# Patient Record
Sex: Male | Born: 1960 | Race: White | Hispanic: No | Marital: Single
Health system: Southern US, Community
[De-identification: ages and names within clinical notes are randomized; demographics above are authoritative.]

---

## 1998-08-31 ENCOUNTER — Inpatient Hospital Stay (HOSPITAL_COMMUNITY): Admission: EM | Admit: 1998-08-31 | Discharge: 1998-09-12 | Payer: Self-pay | Admitting: Emergency Medicine

## 1999-07-30 ENCOUNTER — Encounter: Payer: Self-pay | Admitting: Internal Medicine

## 1999-07-30 ENCOUNTER — Inpatient Hospital Stay (HOSPITAL_COMMUNITY): Admission: EM | Admit: 1999-07-30 | Discharge: 1999-08-07 | Payer: Self-pay | Admitting: *Deleted

## 1999-11-22 ENCOUNTER — Ambulatory Visit (HOSPITAL_COMMUNITY): Admission: RE | Admit: 1999-11-22 | Discharge: 1999-11-22 | Payer: Self-pay | Admitting: Geriatric Medicine

## 1999-12-26 ENCOUNTER — Encounter
Admission: RE | Admit: 1999-12-26 | Discharge: 2000-02-13 | Payer: Self-pay | Admitting: Physical Medicine & Rehabilitation

## 2000-03-13 ENCOUNTER — Encounter: Payer: Self-pay | Admitting: Emergency Medicine

## 2000-03-13 ENCOUNTER — Inpatient Hospital Stay (HOSPITAL_COMMUNITY): Admission: EM | Admit: 2000-03-13 | Discharge: 2000-03-15 | Payer: Self-pay | Admitting: Emergency Medicine

## 2000-12-12 ENCOUNTER — Encounter: Admission: RE | Admit: 2000-12-12 | Discharge: 2000-12-12 | Payer: Self-pay | Admitting: *Deleted

## 2000-12-12 ENCOUNTER — Encounter: Payer: Self-pay | Admitting: *Deleted

## 2001-03-16 ENCOUNTER — Emergency Department (HOSPITAL_COMMUNITY): Admission: EM | Admit: 2001-03-16 | Discharge: 2001-03-16 | Payer: Self-pay | Admitting: *Deleted

## 2002-11-20 ENCOUNTER — Encounter: Payer: Self-pay | Admitting: Geriatric Medicine

## 2002-11-20 ENCOUNTER — Encounter: Admission: RE | Admit: 2002-11-20 | Discharge: 2002-11-20 | Payer: Self-pay | Admitting: Geriatric Medicine

## 2002-11-20 ENCOUNTER — Ambulatory Visit (HOSPITAL_COMMUNITY): Admission: RE | Admit: 2002-11-20 | Discharge: 2002-11-20 | Payer: Self-pay | Admitting: Geriatric Medicine

## 2003-11-03 ENCOUNTER — Encounter (HOSPITAL_BASED_OUTPATIENT_CLINIC_OR_DEPARTMENT_OTHER): Admission: RE | Admit: 2003-11-03 | Discharge: 2003-12-15 | Payer: Self-pay | Admitting: Internal Medicine

## 2003-12-14 ENCOUNTER — Encounter (INDEPENDENT_AMBULATORY_CARE_PROVIDER_SITE_OTHER): Payer: Self-pay | Admitting: Specialist

## 2003-12-14 ENCOUNTER — Inpatient Hospital Stay (HOSPITAL_COMMUNITY): Admission: RE | Admit: 2003-12-14 | Discharge: 2003-12-16 | Payer: Self-pay | Admitting: Orthopedic Surgery

## 2006-01-15 ENCOUNTER — Inpatient Hospital Stay (HOSPITAL_COMMUNITY): Admission: EM | Admit: 2006-01-15 | Discharge: 2006-01-20 | Payer: Self-pay | Admitting: Emergency Medicine

## 2009-08-25 ENCOUNTER — Ambulatory Visit: Payer: Self-pay | Admitting: Diagnostic Radiology

## 2009-08-25 ENCOUNTER — Inpatient Hospital Stay (HOSPITAL_COMMUNITY): Admission: EM | Admit: 2009-08-25 | Discharge: 2009-09-05 | Payer: Self-pay | Admitting: Emergency Medicine

## 2009-08-25 ENCOUNTER — Ambulatory Visit: Payer: Self-pay | Admitting: Pulmonary Disease

## 2009-08-25 ENCOUNTER — Encounter: Payer: Self-pay | Admitting: Internal Medicine

## 2009-08-25 ENCOUNTER — Ambulatory Visit: Payer: Self-pay | Admitting: Cardiology

## 2009-08-25 ENCOUNTER — Encounter: Payer: Self-pay | Admitting: Emergency Medicine

## 2009-08-26 ENCOUNTER — Encounter (INDEPENDENT_AMBULATORY_CARE_PROVIDER_SITE_OTHER): Payer: Self-pay | Admitting: Pulmonary Disease

## 2009-09-04 ENCOUNTER — Encounter: Payer: Self-pay | Admitting: Internal Medicine

## 2009-09-15 ENCOUNTER — Ambulatory Visit: Payer: Self-pay | Admitting: Internal Medicine

## 2009-09-15 DIAGNOSIS — J441 Chronic obstructive pulmonary disease with (acute) exacerbation: Secondary | ICD-10-CM | POA: Insufficient documentation

## 2009-09-15 DIAGNOSIS — S14101A Unspecified injury at C1 level of cervical spinal cord, initial encounter: Secondary | ICD-10-CM | POA: Insufficient documentation

## 2009-09-20 ENCOUNTER — Encounter: Payer: Self-pay | Admitting: Internal Medicine

## 2010-08-10 NOTE — Letter (Signed)
Summary: CMN/SmartVest  CMN/SmartVest   Imported By: Lester Walker 09/27/2009 10:21:24  _____________________________________________________________________  External Attachment:    Type:   Image     Comment:   External Document
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
## 2010-09-28 LAB — BASIC METABOLIC PANEL
BUN: 18 mg/dL (ref 6–23)
CO2: 24 mEq/L (ref 19–32)
Creatinine, Ser: 0.3 mg/dL — ABNORMAL LOW (ref 0.4–1.5)
GFR calc non Af Amer: >60 mL/min (ref >60)
Glucose, Bld: 122 mg/dL — ABNORMAL HIGH (ref 70–99)
Potassium: 3.7 mEq/L (ref 3.5–5.1)

## 2010-09-28 LAB — URINE MICROSCOPIC-ADD ON

## 2010-09-28 LAB — CBC
HCT: 41.2 % (ref 39.0–52.0)
Hemoglobin: 13.9 g/dL (ref 13.0–17.0)
Platelets: 218 10*3/uL (ref 150–400)
RBC: 4.83 MIL/uL (ref 4.22–5.81)

## 2010-09-28 LAB — URINALYSIS, ROUTINE W REFLEX MICROSCOPIC
Bilirubin Urine: NEGATIVE
Glucose, UA: NEGATIVE mg/dL
Nitrite: NEGATIVE
Specific Gravity, Urine: 1.027 (ref 1.005–1.030)
pH: 8 (ref 5.0–8.0)

## 2010-09-28 LAB — DIFFERENTIAL
Eosinophils Absolute: 0 10*3/uL (ref 0.0–0.7)
Eosinophils Relative: 0 % (ref 0–5)
Lymphocytes Relative: 3 % — ABNORMAL LOW (ref 12–46)
Lymphs Abs: 0.3 10*3/uL — ABNORMAL LOW (ref 0.7–4.0)
Monocytes Relative: 3 % (ref 3–12)
Neutro Abs: 9.3 10*3/uL — ABNORMAL HIGH (ref 1.7–7.7)

## 2010-09-28 LAB — CULTURE, BLOOD (ROUTINE X 2): Culture: NO GROWTH

## 2010-09-28 LAB — LACTIC ACID, PLASMA: Lactic Acid, Venous: 3.4 mmol/L — ABNORMAL HIGH (ref 0.5–2.2)

## 2010-09-28 LAB — POCT I-STAT 3, ART BLOOD GAS (G3+)

## 2010-09-28 LAB — URINE CULTURE

## 2010-09-29 LAB — URINALYSIS, ROUTINE W REFLEX MICROSCOPIC
Glucose, UA: NEGATIVE mg/dL
Protein, ur: NEGATIVE mg/dL
Specific Gravity, Urine: 1.043 — ABNORMAL HIGH (ref 1.005–1.030)
Urobilinogen, UA: 1 mg/dL (ref 0.0–1.0)

## 2010-09-29 LAB — CBC
HCT: 35.3 % — ABNORMAL LOW (ref 39.0–52.0)
Hemoglobin: 10.6 g/dL — ABNORMAL LOW (ref 13.0–17.0)
Hemoglobin: 11.4 g/dL — ABNORMAL LOW (ref 13.0–17.0)
Hemoglobin: 11.9 g/dL — ABNORMAL LOW (ref 13.0–17.0)
Hemoglobin: 12.1 g/dL — ABNORMAL LOW (ref 13.0–17.0)
MCHC: 33 g/dL (ref 30.0–36.0)
MCHC: 33.3 g/dL (ref 30.0–36.0)
MCHC: 33.5 g/dL (ref 30.0–36.0)
MCHC: 33.6 g/dL (ref 30.0–36.0)
MCHC: 33.7 g/dL (ref 30.0–36.0)
MCHC: 33.9 g/dL (ref 30.0–36.0)
MCV: 86.2 fL (ref 78.0–100.0)
Platelets: 162 10*3/uL (ref 150–400)
Platelets: 183 10*3/uL (ref 150–400)
Platelets: 195 10*3/uL (ref 150–400)
Platelets: 213 10*3/uL (ref 150–400)
Platelets: 233 10*3/uL (ref 150–400)
Platelets: 324 10*3/uL (ref 150–400)
Platelets: 344 10*3/uL (ref 150–400)
Platelets: 367 10*3/uL (ref 150–400)
RBC: 3.69 MIL/uL — ABNORMAL LOW (ref 4.22–5.81)
RBC: 4.11 MIL/uL — ABNORMAL LOW (ref 4.22–5.81)
RBC: 4.83 MIL/uL (ref 4.22–5.81)
RDW: 15.5 % (ref 11.5–15.5)
RDW: 15.6 % — ABNORMAL HIGH (ref 11.5–15.5)
RDW: 15.8 % — ABNORMAL HIGH (ref 11.5–15.5)
RDW: 15.8 % — ABNORMAL HIGH (ref 11.5–15.5)
RDW: 16 % — ABNORMAL HIGH (ref 11.5–15.5)
RDW: 16.1 % — ABNORMAL HIGH (ref 11.5–15.5)
RDW: 16.1 % — ABNORMAL HIGH (ref 11.5–15.5)
RDW: 16.4 % — ABNORMAL HIGH (ref 11.5–15.5)
WBC: 18 10*3/uL — ABNORMAL HIGH (ref 4.0–10.5)
WBC: 7.1 10*3/uL (ref 4.0–10.5)
WBC: 7.6 10*3/uL (ref 4.0–10.5)
WBC: 8.8 10*3/uL (ref 4.0–10.5)
WBC: 8.9 10*3/uL (ref 4.0–10.5)

## 2010-09-29 LAB — BLOOD GAS, ARTERIAL
Acid-Base Excess: 3.7 mmol/L — ABNORMAL HIGH (ref 0.0–2.0)
Acid-Base Excess: 5.5 mmol/L — ABNORMAL HIGH (ref 0.0–2.0)
FIO2: 0.3 %
FIO2: 0.3 %
MECHVT: 500 mL
O2 Saturation: 94.6 %
Patient temperature: 98.9
RATE: 12 resp/min
TCO2: 28.1 mmol/L (ref 0–100)
pCO2 arterial: 35.6 mmHg (ref 35.0–45.0)
pH, Arterial: 7.493 — ABNORMAL HIGH (ref 7.350–7.450)
pO2, Arterial: 57.6 mmHg — ABNORMAL LOW (ref 80.0–100.0)

## 2010-09-29 LAB — BASIC METABOLIC PANEL
BUN: 12 mg/dL (ref 6–23)
BUN: 15 mg/dL (ref 6–23)
BUN: 21 mg/dL (ref 6–23)
BUN: 29 mg/dL — ABNORMAL HIGH (ref 6–23)
BUN: 36 mg/dL — ABNORMAL HIGH (ref 6–23)
BUN: 6 mg/dL (ref 6–23)
CO2: 21 mEq/L (ref 19–32)
CO2: 22 mEq/L (ref 19–32)
CO2: 29 mEq/L (ref 19–32)
CO2: 30 mEq/L (ref 19–32)
Calcium: 7.4 mg/dL — ABNORMAL LOW (ref 8.4–10.5)
Calcium: 7.5 mg/dL — ABNORMAL LOW (ref 8.4–10.5)
Calcium: 7.7 mg/dL — ABNORMAL LOW (ref 8.4–10.5)
Calcium: 8 mg/dL — ABNORMAL LOW (ref 8.4–10.5)
Calcium: 8.1 mg/dL — ABNORMAL LOW (ref 8.4–10.5)
Calcium: 8.3 mg/dL — ABNORMAL LOW (ref 8.4–10.5)
Calcium: 8.5 mg/dL (ref 8.4–10.5)
Calcium: 8.9 mg/dL (ref 8.4–10.5)
Chloride: 108 mEq/L (ref 96–112)
Chloride: 112 mEq/L (ref 96–112)
Chloride: 96 mEq/L (ref 96–112)
Chloride: 97 mEq/L (ref 96–112)
Creatinine, Ser: <0.3 mg/dL — ABNORMAL LOW (ref 0.4–1.5)
Creatinine, Ser: <0.3 mg/dL — ABNORMAL LOW (ref 0.4–1.5)
Creatinine, Ser: <0.3 mg/dL — ABNORMAL LOW (ref 0.4–1.5)
Creatinine, Ser: <0.3 mg/dL — ABNORMAL LOW (ref 0.4–1.5)
Creatinine, Ser: <0.3 mg/dL — ABNORMAL LOW (ref 0.4–1.5)
Creatinine, Ser: <0.3 mg/dL — ABNORMAL LOW (ref 0.4–1.5)
Creatinine, Ser: <0.3 mg/dL — ABNORMAL LOW (ref 0.4–1.5)
Creatinine, Ser: <0.3 mg/dL — ABNORMAL LOW (ref 0.4–1.5)
Creatinine, Ser: <0.3 mg/dL — ABNORMAL LOW (ref 0.4–1.5)
Glucose, Bld: 101 mg/dL — ABNORMAL HIGH (ref 70–99)
Glucose, Bld: 140 mg/dL — ABNORMAL HIGH (ref 70–99)
Glucose, Bld: 148 mg/dL — ABNORMAL HIGH (ref 70–99)
Glucose, Bld: 168 mg/dL — ABNORMAL HIGH (ref 70–99)
Glucose, Bld: 63 mg/dL — ABNORMAL LOW (ref 70–99)
Potassium: 2.9 mEq/L — ABNORMAL LOW (ref 3.5–5.1)
Potassium: 3.1 mEq/L — ABNORMAL LOW (ref 3.5–5.1)
Potassium: 4.8 mEq/L (ref 3.5–5.1)
Sodium: 131 mEq/L — ABNORMAL LOW (ref 135–145)
Sodium: 139 mEq/L (ref 135–145)
Sodium: 143 mEq/L (ref 135–145)

## 2010-09-29 LAB — MAGNESIUM
Magnesium: 1.8 mg/dL (ref 1.5–2.5)
Magnesium: 1.8 mg/dL (ref 1.5–2.5)
Magnesium: 1.8 mg/dL (ref 1.5–2.5)
Magnesium: 2 mg/dL (ref 1.5–2.5)
Magnesium: 2 mg/dL (ref 1.5–2.5)
Magnesium: 2.1 mg/dL (ref 1.5–2.5)
Magnesium: 2.1 mg/dL (ref 1.5–2.5)
Magnesium: 2.4 mg/dL (ref 1.5–2.5)

## 2010-09-29 LAB — POCT I-STAT 3, ART BLOOD GAS (G3+)
Acid-Base Excess: 5 mmol/L — ABNORMAL HIGH (ref 0.0–2.0)
Acid-Base Excess: 7 mmol/L — ABNORMAL HIGH (ref 0.0–2.0)
Acid-base deficit: 5 mmol/L — ABNORMAL HIGH (ref 0.0–2.0)
Bicarbonate: 18.2 mEq/L — ABNORMAL LOW (ref 20.0–24.0)
Bicarbonate: 20 mEq/L (ref 20.0–24.0)
Bicarbonate: 20.8 mEq/L (ref 20.0–24.0)
O2 Saturation: 100 %
O2 Saturation: 96 %
O2 Saturation: 99 %
O2 Saturation: 99 %
Patient temperature: 97.8
Patient temperature: 98.2
Patient temperature: 99.2
Patient temperature: 99.3
TCO2: 21 mmol/L (ref 0–100)
TCO2: 22 mmol/L (ref 0–100)
pCO2 arterial: 32.7 mmHg — ABNORMAL LOW (ref 35.0–45.0)
pCO2 arterial: 36.9 mmHg (ref 35.0–45.0)
pCO2 arterial: 37.6 mmHg (ref 35.0–45.0)
pH, Arterial: 7.344 — ABNORMAL LOW (ref 7.350–7.450)
pH, Arterial: 7.36 (ref 7.350–7.450)
pO2, Arterial: 125 mmHg — ABNORMAL HIGH (ref 80.0–100.0)

## 2010-09-29 LAB — MRSA PCR SCREENING
MRSA by PCR: NEGATIVE
MRSA by PCR: NEGATIVE

## 2010-09-29 LAB — HEPATIC FUNCTION PANEL
ALT: 11 U/L (ref 0–53)
AST: 15 U/L (ref 0–37)
Alkaline Phosphatase: 64 U/L (ref 39–117)
Bilirubin, Direct: 0.3 mg/dL (ref 0.0–0.3)

## 2010-09-29 LAB — CARBOXYHEMOGLOBIN
Carboxyhemoglobin: 0.8 % (ref 0.5–1.5)
Methemoglobin: 0.9 % (ref 0.0–1.5)

## 2010-09-29 LAB — EXPECTORATED SPUTUM ASSESSMENT W GRAM STAIN, RFLX TO RESP C

## 2010-09-29 LAB — POTASSIUM: Potassium: 3.4 mEq/L — ABNORMAL LOW (ref 3.5–5.1)

## 2010-09-29 LAB — CULTURE, BLOOD (ROUTINE X 2): Culture: NO GROWTH

## 2010-09-29 LAB — URINE MICROSCOPIC-ADD ON

## 2010-09-29 LAB — CULTURE, BAL-QUANTITATIVE W GRAM STAIN: Colony Count: NO GROWTH

## 2010-09-29 LAB — PHOSPHORUS
Phosphorus: 1.6 mg/dL — ABNORMAL LOW (ref 2.3–4.6)
Phosphorus: 1.7 mg/dL — ABNORMAL LOW (ref 2.3–4.6)
Phosphorus: 1.9 mg/dL — ABNORMAL LOW (ref 2.3–4.6)
Phosphorus: 3.3 mg/dL (ref 2.3–4.6)
Phosphorus: <1 mg/dL — CL (ref 2.3–4.6)

## 2010-09-29 LAB — DIFFERENTIAL
Basophils Absolute: 0 10*3/uL (ref 0.0–0.1)
Lymphocytes Relative: 4 % — ABNORMAL LOW (ref 12–46)
Neutro Abs: 9.4 10*3/uL — ABNORMAL HIGH (ref 1.7–7.7)
Neutrophils Relative %: 93 % — ABNORMAL HIGH (ref 43–77)

## 2010-09-29 LAB — CORTISOL: Cortisol, Plasma: 17.2 ug/dL

## 2010-09-29 LAB — CARDIAC PANEL(CRET KIN+CKTOT+MB+TROPI): CK, MB: 2 ng/mL (ref 0.3–4.0)

## 2010-09-29 LAB — PROTIME-INR
INR: 1.52 — ABNORMAL HIGH (ref 0.00–1.49)
Prothrombin Time: 18.2 seconds — ABNORMAL HIGH (ref 11.6–15.2)

## 2010-09-29 LAB — CULTURE, RESPIRATORY W GRAM STAIN: Culture: NORMAL

## 2010-09-29 LAB — BRAIN NATRIURETIC PEPTIDE: Pro B Natriuretic peptide (BNP): 212 pg/mL — ABNORMAL HIGH (ref 0.0–100.0)

## 2010-09-29 LAB — APTT: aPTT: 36 seconds (ref 24–37)

## 2010-09-29 LAB — LIPASE, BLOOD: Lipase: 14 U/L (ref 11–59)

## 2010-11-24 NOTE — Op Note (Signed)
[name] [name], [name] [name]                         ACCOUNT NO.:  [id number]   MEDICAL RECORD NO.:  [id number]                   PATIENT TYPE:  INP   LOCATION:  6707                                 FACILITY:  [facility]   PHYSICIAN:  Nadara Mustard, M.D.                DATE OF BIRTH:  1960-07-25   DATE OF PROCEDURE:  12/14/2003  DATE OF DISCHARGE:                                 OPERATIVE REPORT   PREOPERATIVE DIAGNOSIS:  Chronic stasis and decubitus ulcers, bilateral  lower extremities, with osteomyelitis.   POSTOPERATIVE DIAGNOSIS:  Chronic stasis and decubitus ulcers, bilateral  lower extremities, with osteomyelitis.   PROCEDURE:  Bilateral above-knee amputations.   SURGEON:  Nadara Mustard, M.D.   ANESTHESIA:  General.   ESTIMATED BLOOD LOSS:  Minimal.   ANTIBIOTICS:  1 g Kefzol IV.   TOURNIQUET TIME:  14 minutes on the right at 300 mmHg and 8 minutes on the  left at 300 mmHg.   DISPOSITION:  To PACU in stable condition.   INDICATION FOR PROCEDURE:  The patient is a 50 year old gentleman with  quadriplegia, with insensate from the waist down.  The patient has had  chronic ulcers in both lower extremities.  He has been treated  conservatively at the diabetic foot center with failure of healing of the  wounds.  The patient has chronic osteomyelitis with chronic ulcers.  The  patient has also has fixed knee contractures at approximately 30 degrees  with a nonfunctional knee joint.  The patient's calf muscle is essentially  absent due to atrophy and would have no soft tissue to cover the end of a  below-knee amputation stump, and he would have no function with his knees.  The patient elected to proceed with above-knee amputation at this time  bilaterally.  Risks and benefits were discussed, including infection,  neurovascular injury, pain, nonhealing of the wound, and need for additional  surgery.  The patient states he understands and wishes to proceed at this  time.   DESCRIPTION OF PROCEDURE:  The patient was brought to OR room 1 and  underwent a general anesthetic.  After an adequate level of anesthesia was  obtained, the patient's bilateral lower extremities were prepped using  Duraprep, draped in a sterile field, and the both legs were draped into an  impervious stockinette.  Attention was first focused on the right leg.  The  leg was elevated and the tourniquet inflated to 300 mmHg.  A fishmouth  incision was made just proximal to the patella.  The femur was cut just  proximal to the flare of the femoral condyles.  The patient had large  osteophytic bone spurs of the distal femur, and these were resected.  The  sciatic nerve was suture ligated x1.  The vascular bundles were suture  ligated x3 each.  The tourniquet was deflated, hemostasis was obtained.  The  deep and superficial fascial layers  were closed using #1 PDS and the skin  was closed using Proximate staples.  The wound was covered with Adaptic  orthopedic sponges, ABD dressing, Webril, and a Coban dressing.  The  tourniquet was then inflated on the left lower extremity after the leg was  elevated and the tourniquet inflated at the thigh at 300 mmHg.  A fishmouth  incision was also made just proximal to the patella.  The femur was  transected just proximal to the flare of the condyles.  The vascular bundles  were suture ligated x3 each with 2-0 silk.  The sciatic nerve was suture  ligated with 2-0 silk as well.  The tourniquet was deflated after eight  minutes.  Hemostasis was obtained.  The deep and superficial fascial layers  were closed using #1 PDS.  The skin was closed using Proximate staples.  The  wound was covered with Adaptic orthopedic sponges, ABD dressing, Webril, and  a Coban dressing.  The patient was extubated, taken to PACU in stable  condition.                                               Nadara Mustard, M.D.    MVD/MEDQ  D:  12/14/2003  T:  12/14/2003  Job:  [id number]
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
## 2010-11-24 NOTE — Discharge Summary (Signed)
[facility]  Patient:    [name] [name], [name] [name]                      MRN: [id number] Adm. Date:  [id number] Disc. Date: [id number] Attending:  Ginette Otto                           Discharge Summary  ADMITTING DIAGNOSIS:  Urosepsis.  DISCHARGE DIAGNOSES: 1. Urinary tract infection with dehydration. 2. Quadriplegia C4-C5 complicated by lower extremity contractures, chronic    leg edema and recurrent cellulitis.  BRIEF HISTORY:  [name] [name] is a delightful 50 year old white male who works at [employer]. 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 He has had chronic problems with lower extremity edema, but has been doing wraps with elastic bandages under the direction of Dr. Murray Hodgkins and that has helped significantly compress his legs and reduce the edema. He now comes in feeling weak for a couple of hours. He came into the emergency room with a systolic blood pressure of 60. He chronically runs a low systolic blood pressure in the 80s and 90s. He was found to have a urinary tract infection, he does use a condom catheter. He has felt light-headed. He denied any cough, was not aware of any fever, no evidence of cellulitis.  PHYSICAL EXAMINATION:  VITAL SIGNS: Blood pressure 81/60, temperature 98.3.  HEENT:  Normal.  LUNGS:  Clear.  HEART:  Regular rate and rhythm without murmurs, rubs or gallops.  ABDOMEN:  Soft with active bowel sounds. No hepatosplenomegaly or masses palpated.  LOWER EXTREMITIES:  1+ edema, a few excoriated areas, but no evidence of red warm skin.  LABORATORY DATA ON ADMISSION:  Sodium 139, potassium 3.6, chloride 106, bicarbonate 29, glucose 94, BUN 9, creatinine 0.4, calcium 8.1. White count 14,000, hemoglobin 14.5, hematocrit 42.8, platelet count 231,000. 90% neutrophils, 5% lymphocytes, 3% monocytes.  Urinalysis remarkable for large leukocyte esterase, 21-50 WBCs, 6-10 RBCs.  HOSPITAL COURSE:  The patient was placed  on IV Rocephin. He was given IV hydration. His blood pressure came up between 90 and 100. He remained afebrile during his hospitalization and felt much better. Blood cultures were negative. Apparently urine culture, that was to be done in the emergency room, was not done and not reported in the computer, but he did respond clinically.  DISCHARGE CONDITION:  The patient is discharged home in an improved condition.  DISCHARGE MEDICATIONS: 1. Levaquin 250 mg a day for nine days. 2. Advair 100/50 mg one puff twice a day to help with pulmonary secretions. 3. Belladonna 0.6 cc once a day as needed for sweating, this replaces    propantheline which is now off the market, which he had been using    previously.  DISCHARGE INSTRUCTIONS:  [name] will be seen back in the office in approximately four to five weeks. DD:  03/15/00 TD:  03/16/00 Job: [id number]
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
## 2010-11-24 NOTE — Discharge Summary (Signed)
[name] [name], [name] [name]                         ACCOUNT NO.:  [id number]   MEDICAL RECORD NO.:  [id number]                   PATIENT TYPE:  INP   LOCATION:  6707                                 FACILITY:  [facility]   PHYSICIAN:  Nadara Mustard, M.D.                DATE OF BIRTH:  Nov 23, 1960   DATE OF ADMISSION:  12/14/2003  DATE OF DISCHARGE:  12/16/2003                                 DISCHARGE SUMMARY   DIAGNOSIS:  Osteomyelitis with chronic ulcers of legs with quadriplegia.   PROCEDURE:  Bilateral above-the-knee amputations.   DISPOSITION:  Discharged to home in stable condition.   FOLLOWUP:  Follow up in the office in one week.   DISCHARGE MEDICATIONS:  The patient requests no pain medication at this  time.   HISTORY OF PRESENT ILLNESS:  The patient is a 50 year old gentleman with  quadriplegia with osteomyelitis and ischemic ulcers of both lower  extremities.  The patient has failed conservative care with wound care to  relieve the ulcers in both legs, and presents at this time for bilateral  above-the-knee amputations.  Risks and benefits of the surgery were  discussed.  The patient states he understands and wishes to proceed at this  time.   HOSPITAL COURSE:  The patient's hospital course was essentially  unremarkable.  He underwent bilateral above-the-knee amputations on December 14, 2003.  He was working with transfer training with physical therapy, and was  discharged to home in stable condition on December 16, 2003.  The patient  requests no pain medication at this time.  He plans to follow up in the  office in one week.                                                Nadara Mustard, M.D.    MVD/MEDQ  D:  01/22/2004  T:  01/22/2004  Job:  [id number]

## 2010-11-24 NOTE — Discharge Summary (Signed)
[name], [name]               ACCOUNT NO.:  [id number]   MEDICAL RECORD NO.:  [id number]          PATIENT TYPE:  INP   LOCATION:  2923                         FACILITY:  [facility]   PHYSICIAN:  Kela Millin, M.D.DATE OF BIRTH:  1961/04/24   DATE OF ADMISSION:  01/15/2006  DATE OF DISCHARGE:  01/20/2006                                 DISCHARGE SUMMARY   DISCHARGE DIAGNOSES:  1.  Bilateral pneumonia, probable aspiration pneumonia with hypoxia.  2.  Probable urinary tract infection.  3.  C4-5 quadriplegia.  4.  Status post bilateral above-knee amputations.   HISTORY:  The patient is a 50 year old white male who presented with a 24-  hour history of increasing shortness of breath and inability to expectorate  mucus.  He had felt feverish, and when he was evaluated in the office he was  found to be hypoxic with a pulse ox of 76%.  He was given a Xopenex  nebulizer treatment and placed on 3 liters of nasal canula 02, and his pulse  ox improved to 82%.  He was admitted to the [facility] service for  evaluation and management.   PHYSICAL EXAM ON ADMISSION:  VITAL SIGNS:  Blood pressure:  110/80.  Temperature:  98.6.  Pulse:  108.  Pulse ox:  76% which improved to 82% as  above.  LUNGS:  Decreased breath sounds throughout.  After his breathing treatment,  he had bilateral rhonchi.   LABORATORY DATA UPON ADMISSION:  White cell count 12.0, hemoglobin 12.3,  hematocrit 37.7, with a platelet count of 252,000.  His sodium was 136 with  a potassium of 4.0, chloride 102, C02 26, glucose 88, BUN 11, creatinine  0.4.  His LFT's were within normal limits.  Urinalysis was consistent with a  urinary tract infection, nitrite positive, leukocyte esterase large, white  blood cells 11 to 20, with many bacteria.  He is ABG showed a pH of 7.35,  PC02 of 43.6, P02 of 64, and 02 sat of 91%.  The patient had a chest x-ray  done which showed bibasilar air space disease, consistent with pneumonia  and  possibly aspiration.   HOSPITAL COURSE:  Problem 1.  Bilateral pneumonia, possibly aspiration - The  patient was empirically placed on antibiotics upon admission.  He had blood  cultures done, and one out of the three blood cultures that we have drawn  grew coag negative as staphylococcus.  Initially before the bacteria was  identified, the patient was placed on Vancomycin empirically.  The patient's  antibiotics were also changed to cover for aspiration pneumonia as well as  for a UTI.  Following this intervention, the patient defervesced.  Vancomycin was then discontinued and the patient was monitored.  The patient  remained afebrile off of the Vancomycin, and so the impression was that the  coagulase negative staphylococcus in one bottle was likely a contaminate.  The patient required aggressive pulmonary toileting during his hospital stay  and was noted to intermittently drop his 02 SATs to as low as 79%,  documented in the hospital.  The patient's leukocytosis has resolved with  a  white cell count of 8.1 today upon discharge. He has remained afebrile for  the past 48 hours.  Based on his 02 SATs dropping on room air to 79%, he  meets the criteria for home 02, and he will be discharged on nasal canula  oxygen.  The patient will also continue oral antibiotics upon discharge and  is to follow up with his primary care physician, Dr. Pete Glatter.   Problem 2.  Urinary tract infection - The patient was found to be febrile  during his hospital stay.  A urinalysis was done and the results were as  stated above.  Following the initiation of Zosyn, the patient defervesced  and has remained afebrile with no leukocytosis.  He is being discharged on  antibiotics as above, and he will follow up with his primary care physician.   DISCHARGE MEDICATIONS:  1.  Augmentin 875 mg one p.o. b.i.d.  2.  Guaifenesin 1200 mg p.o. q. 12 hours.  3.  Albuterol nebulizer q. 4 hours p.r.n.  4.  The patient  is to continue pre-admission medications.  5.  Nasal canula oxygen 2 liters.   FOLLOW-UP CARE:  1.  Primary care physician - Dr. Pete Glatter in seven to ten days.  2.  Special instructions - Aspiration instructions as directed.   DISCHARGE CONDITION:  Improved/stable.      Kela Millin, M.D.  Electronically Signed     ACV/MEDQ  D:  01/20/2006  T:  01/20/2006  Job:  [id number]   cc:   Hal T. Stoneking, M.D.  Fax: [phone]
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
## 2015-09-14 ENCOUNTER — Inpatient Hospital Stay
Admission: RE | Admit: 2015-09-14 | Discharge: 2015-10-11 | Disposition: A | Discharge status: Skilled Nursing Facility | Payer: Self-pay | Source: Other Acute Inpatient Hospital | Attending: Internal Medicine | Admitting: Internal Medicine

## 2015-09-14 DIAGNOSIS — T17908A Unspecified foreign body in respiratory tract, part unspecified causing other injury, initial encounter: Secondary | ICD-10-CM

## 2015-09-14 DIAGNOSIS — L89159 Pressure ulcer of sacral region, unspecified stage: Secondary | ICD-10-CM

## 2015-09-14 DIAGNOSIS — J189 Pneumonia, unspecified organism: Secondary | ICD-10-CM

## 2015-09-14 DIAGNOSIS — J90 Pleural effusion, not elsewhere classified: Secondary | ICD-10-CM

## 2015-09-14 DIAGNOSIS — Z89611 Acquired absence of right leg above knee: Secondary | ICD-10-CM

## 2015-09-14 DIAGNOSIS — Z89612 Acquired absence of left leg above knee: Secondary | ICD-10-CM

## 2015-09-14 DIAGNOSIS — Z431 Encounter for attention to gastrostomy: Secondary | ICD-10-CM

## 2015-09-14 DIAGNOSIS — Z931 Gastrostomy status: Secondary | ICD-10-CM

## 2015-09-14 DIAGNOSIS — Z930 Tracheostomy status: Secondary | ICD-10-CM

## 2015-09-14 DIAGNOSIS — Z433 Encounter for attention to colostomy: Secondary | ICD-10-CM

## 2015-09-14 DIAGNOSIS — J9819 Other pulmonary collapse: Secondary | ICD-10-CM

## 2015-09-14 DIAGNOSIS — J9690 Respiratory failure, unspecified, unspecified whether with hypoxia or hypercapnia: Secondary | ICD-10-CM

## 2015-09-14 DIAGNOSIS — R1310 Dysphagia, unspecified: Secondary | ICD-10-CM

## 2015-09-14 DIAGNOSIS — Z4659 Encounter for fitting and adjustment of other gastrointestinal appliance and device: Secondary | ICD-10-CM

## 2015-09-15 LAB — CBC
HCT: 40.7 % (ref 39.0–52.0)
HEMOGLOBIN: 13.2 g/dL (ref 13.0–17.0)
MCH: 29.4 pg (ref 26.0–34.0)
MCHC: 32.4 g/dL (ref 30.0–36.0)
MCV: 90.6 fL (ref 78.0–100.0)
Platelets: 187 10*3/uL (ref 150–400)
RBC: 4.49 MIL/uL (ref 4.22–5.81)
RDW: 15 % (ref 11.5–15.5)
WBC: 5 10*3/uL (ref 4.0–10.5)

## 2015-09-15 LAB — COMPREHENSIVE METABOLIC PANEL
ALK PHOS: 73 U/L (ref 38–126)
ALT: 23 U/L (ref 17–63)
AST: 25 U/L (ref 15–41)
Albumin: 2.4 g/dL — ABNORMAL LOW (ref 3.5–5.0)
Anion gap: 7 (ref 5–15)
BUN: 9 mg/dL (ref 6–20)
CALCIUM: 8.6 mg/dL — AB (ref 8.9–10.3)
CO2: 28 mmol/L (ref 22–32)
Chloride: 108 mmol/L (ref 101–111)
Glucose, Bld: 78 mg/dL (ref 65–99)
Potassium: 3.7 mmol/L (ref 3.5–5.1)
Sodium: 143 mmol/L (ref 135–145)
Total Bilirubin: 0.8 mg/dL (ref 0.3–1.2)
Total Protein: 5.8 g/dL — ABNORMAL LOW (ref 6.5–8.1)

## 2015-09-15 LAB — TSH: TSH: 0.832 u[IU]/mL (ref 0.350–4.500)

## 2015-09-16 ENCOUNTER — Other Ambulatory Visit (HOSPITAL_COMMUNITY): Payer: Self-pay

## 2015-09-16 DIAGNOSIS — J9819 Other pulmonary collapse: Secondary | ICD-10-CM

## 2015-09-16 DIAGNOSIS — Z433 Encounter for attention to colostomy: Secondary | ICD-10-CM

## 2015-09-16 DIAGNOSIS — J9621 Acute and chronic respiratory failure with hypoxia: Secondary | ICD-10-CM

## 2015-09-16 DIAGNOSIS — Z930 Tracheostomy status: Secondary | ICD-10-CM

## 2015-09-16 DIAGNOSIS — L89159 Pressure ulcer of sacral region, unspecified stage: Secondary | ICD-10-CM

## 2015-09-16 DIAGNOSIS — Z89611 Acquired absence of right leg above knee: Secondary | ICD-10-CM

## 2015-09-16 DIAGNOSIS — Z89612 Acquired absence of left leg above knee: Secondary | ICD-10-CM

## 2015-09-16 LAB — URINALYSIS, ROUTINE W REFLEX MICROSCOPIC
BILIRUBIN URINE: NEGATIVE
Glucose, UA: NEGATIVE mg/dL
Ketones, ur: 15 mg/dL — AB
NITRITE: NEGATIVE
PH: 7 (ref 5.0–8.0)
Protein, ur: NEGATIVE mg/dL
SPECIFIC GRAVITY, URINE: 1.009 (ref 1.005–1.030)

## 2015-09-16 LAB — BASIC METABOLIC PANEL
Anion gap: 8 (ref 5–15)
BUN: 15 mg/dL (ref 6–20)
CHLORIDE: 104 mmol/L (ref 101–111)
CO2: 28 mmol/L (ref 22–32)
CREATININE: 0.36 mg/dL — AB (ref 0.61–1.24)
Calcium: 9 mg/dL (ref 8.9–10.3)
GFR calc Af Amer: >60 mL/min (ref >60)
GFR calc non Af Amer: >60 mL/min (ref >60)
GLUCOSE: 83 mg/dL (ref 65–99)
Potassium: 4.1 mmol/L (ref 3.5–5.1)
SODIUM: 140 mmol/L (ref 135–145)

## 2015-09-16 LAB — CBC WITH DIFFERENTIAL/PLATELET
Basophils Absolute: 0 10*3/uL (ref 0.0–0.1)
Basophils Relative: 1 %
EOS ABS: 0.3 10*3/uL (ref 0.0–0.7)
EOS PCT: 5 %
HCT: 43.4 % (ref 39.0–52.0)
HEMOGLOBIN: 13.8 g/dL (ref 13.0–17.0)
LYMPHS ABS: 1.1 10*3/uL (ref 0.7–4.0)
LYMPHS PCT: 19 %
MCH: 28.8 pg (ref 26.0–34.0)
MCHC: 31.8 g/dL (ref 30.0–36.0)
MCV: 90.6 fL (ref 78.0–100.0)
MONOS PCT: 11 %
Monocytes Absolute: 0.6 10*3/uL (ref 0.1–1.0)
Neutro Abs: 3.7 10*3/uL (ref 1.7–7.7)
Neutrophils Relative %: 64 %
Platelets: 217 10*3/uL (ref 150–400)
RBC: 4.79 MIL/uL (ref 4.22–5.81)
RDW: 15 % (ref 11.5–15.5)
WBC: 5.8 10*3/uL (ref 4.0–10.5)

## 2015-09-16 LAB — URINE MICROSCOPIC-ADD ON
BACTERIA UA: NONE SEEN
SQUAMOUS EPITHELIAL / LPF: NONE SEEN

## 2015-09-16 LAB — MAGNESIUM: MAGNESIUM: 2.4 mg/dL (ref 1.7–2.4)

## 2015-09-16 LAB — PHOSPHORUS: Phosphorus: 2.3 mg/dL — ABNORMAL LOW (ref 2.5–4.6)

## 2015-09-16 LAB — C-REACTIVE PROTEIN: CRP: 1.4 mg/dL — ABNORMAL HIGH (ref <1.0)
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
## 2015-09-17 ENCOUNTER — Other Ambulatory Visit (HOSPITAL_COMMUNITY): Payer: Self-pay

## 2015-09-17 ENCOUNTER — Institutional Professional Consult (permissible substitution) (HOSPITAL_COMMUNITY): Payer: Self-pay

## 2015-09-17 LAB — CBC WITH DIFFERENTIAL/PLATELET
BASOS ABS: 0 10*3/uL (ref 0.0–0.1)
BASOS PCT: 0 %
Eosinophils Absolute: 0.3 10*3/uL (ref 0.0–0.7)
Eosinophils Relative: 6 %
HEMATOCRIT: 41.5 % (ref 39.0–52.0)
HEMOGLOBIN: 13.1 g/dL (ref 13.0–17.0)
Lymphocytes Relative: 17 %
Lymphs Abs: 0.9 10*3/uL (ref 0.7–4.0)
MCH: 28.7 pg (ref 26.0–34.0)
MCHC: 31.6 g/dL (ref 30.0–36.0)
MCV: 91 fL (ref 78.0–100.0)
Monocytes Absolute: 0.6 10*3/uL (ref 0.1–1.0)
Monocytes Relative: 11 %
NEUTROS ABS: 3.2 10*3/uL (ref 1.7–7.7)
NEUTROS PCT: 65 %
Platelets: 198 10*3/uL (ref 150–400)
RBC: 4.56 MIL/uL (ref 4.22–5.81)
RDW: 15 % (ref 11.5–15.5)
WBC: 5 10*3/uL (ref 4.0–10.5)

## 2015-09-17 LAB — PHOSPHORUS: PHOSPHORUS: 2.1 mg/dL — AB (ref 2.5–4.6)

## 2015-09-17 LAB — URINE CULTURE: Culture: NO GROWTH

## 2015-09-17 LAB — BASIC METABOLIC PANEL
ANION GAP: 8 (ref 5–15)
BUN: 11 mg/dL (ref 6–20)
CHLORIDE: 103 mmol/L (ref 101–111)
CO2: 30 mmol/L (ref 22–32)
Calcium: 8.8 mg/dL — ABNORMAL LOW (ref 8.9–10.3)
Creatinine, Ser: <0.3 mg/dL — ABNORMAL LOW (ref 0.61–1.24)
Glucose, Bld: 83 mg/dL (ref 65–99)
Potassium: 3.8 mmol/L (ref 3.5–5.1)
SODIUM: 141 mmol/L (ref 135–145)

## 2015-09-17 LAB — MAGNESIUM: MAGNESIUM: 2.5 mg/dL — AB (ref 1.7–2.4)

## 2015-09-18 ENCOUNTER — Other Ambulatory Visit (HOSPITAL_COMMUNITY): Payer: Self-pay

## 2015-09-19 LAB — BASIC METABOLIC PANEL
ANION GAP: 7 (ref 5–15)
BUN: 27 mg/dL — ABNORMAL HIGH (ref 6–20)
CALCIUM: 8.6 mg/dL — AB (ref 8.9–10.3)
CO2: 32 mmol/L (ref 22–32)
Chloride: 101 mmol/L (ref 101–111)
Creatinine, Ser: <0.3 mg/dL — ABNORMAL LOW (ref 0.61–1.24)
Glucose, Bld: 109 mg/dL — ABNORMAL HIGH (ref 65–99)
Potassium: 5.3 mmol/L — ABNORMAL HIGH (ref 3.5–5.1)
SODIUM: 140 mmol/L (ref 135–145)

## 2015-09-19 LAB — CBC WITH DIFFERENTIAL/PLATELET
BASOS ABS: 0 10*3/uL (ref 0.0–0.1)
BASOS PCT: 0 %
EOS ABS: 0.3 10*3/uL (ref 0.0–0.7)
EOS PCT: 4 %
HEMATOCRIT: 42.5 % (ref 39.0–52.0)
Hemoglobin: 13.3 g/dL (ref 13.0–17.0)
Lymphocytes Relative: 9 %
Lymphs Abs: 0.7 10*3/uL (ref 0.7–4.0)
MCH: 28.9 pg (ref 26.0–34.0)
MCHC: 31.3 g/dL (ref 30.0–36.0)
MCV: 92.2 fL (ref 78.0–100.0)
MONO ABS: 1 10*3/uL (ref 0.1–1.0)
Monocytes Relative: 13 %
NEUTROS ABS: 5.5 10*3/uL (ref 1.7–7.7)
Neutrophils Relative %: 74 %
PLATELETS: 238 10*3/uL (ref 150–400)
RBC: 4.61 MIL/uL (ref 4.22–5.81)
RDW: 15.1 % (ref 11.5–15.5)
WBC: 7.5 10*3/uL (ref 4.0–10.5)

## 2015-09-19 LAB — MAGNESIUM: MAGNESIUM: 2.5 mg/dL — AB (ref 1.7–2.4)

## 2015-09-19 LAB — PHOSPHORUS: PHOSPHORUS: 1.6 mg/dL — AB (ref 2.5–4.6)

## 2015-09-19 LAB — SEDIMENTATION RATE: SED RATE: 51 mm/h — AB (ref 0–16)
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
## 2015-09-20 LAB — BASIC METABOLIC PANEL
Anion gap: 8 (ref 5–15)
BUN: 36 mg/dL — ABNORMAL HIGH (ref 6–20)
CALCIUM: 8.9 mg/dL (ref 8.9–10.3)
CHLORIDE: 104 mmol/L (ref 101–111)
CO2: 32 mmol/L (ref 22–32)
Glucose, Bld: 122 mg/dL — ABNORMAL HIGH (ref 65–99)
Potassium: 4.1 mmol/L (ref 3.5–5.1)
SODIUM: 144 mmol/L (ref 135–145)

## 2015-09-20 LAB — CULTURE, RESPIRATORY W GRAM STAIN

## 2015-09-20 LAB — MAGNESIUM: MAGNESIUM: 2.5 mg/dL — AB (ref 1.7–2.4)

## 2015-09-20 LAB — CULTURE, RESPIRATORY

## 2015-09-20 LAB — PHOSPHORUS: PHOSPHORUS: 2.6 mg/dL (ref 2.5–4.6)

## 2015-09-21 LAB — BASIC METABOLIC PANEL
ANION GAP: 7 (ref 5–15)
BUN: 31 mg/dL — ABNORMAL HIGH (ref 6–20)
CHLORIDE: 104 mmol/L (ref 101–111)
CO2: 31 mmol/L (ref 22–32)
Calcium: 8.8 mg/dL — ABNORMAL LOW (ref 8.9–10.3)
Creatinine, Ser: <0.3 mg/dL — ABNORMAL LOW (ref 0.61–1.24)
GLUCOSE: 89 mg/dL (ref 65–99)
POTASSIUM: 4.6 mmol/L (ref 3.5–5.1)
Sodium: 142 mmol/L (ref 135–145)

## 2015-09-21 LAB — CBC
HEMATOCRIT: 39.8 % (ref 39.0–52.0)
HEMOGLOBIN: 12.5 g/dL — AB (ref 13.0–17.0)
MCH: 29.5 pg (ref 26.0–34.0)
MCHC: 31.4 g/dL (ref 30.0–36.0)
MCV: 93.9 fL (ref 78.0–100.0)
Platelets: 225 10*3/uL (ref 150–400)
RBC: 4.24 MIL/uL (ref 4.22–5.81)
RDW: 15 % (ref 11.5–15.5)
WBC: 6.4 10*3/uL (ref 4.0–10.5)

## 2015-09-22 ENCOUNTER — Other Ambulatory Visit (HOSPITAL_COMMUNITY): Payer: Self-pay

## 2015-09-22 LAB — VANCOMYCIN, TROUGH
Vancomycin Tr: 25 ug/mL — ABNORMAL HIGH (ref 10.0–20.0)
Vancomycin Tr: 13 ug/mL (ref 10.0–20.0)
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
## 2015-09-23 ENCOUNTER — Other Ambulatory Visit (HOSPITAL_COMMUNITY): Payer: Self-pay

## 2015-09-24 ENCOUNTER — Other Ambulatory Visit (HOSPITAL_COMMUNITY): Payer: Self-pay

## 2015-09-25 LAB — VANCOMYCIN, TROUGH: Vancomycin Tr: 20 ug/mL (ref 10.0–20.0)

## 2015-09-26 ENCOUNTER — Other Ambulatory Visit (HOSPITAL_COMMUNITY): Payer: Self-pay

## 2015-09-26 LAB — BASIC METABOLIC PANEL
ANION GAP: 6 (ref 5–15)
BUN: 36 mg/dL — AB (ref 6–20)
CALCIUM: 8.8 mg/dL — AB (ref 8.9–10.3)
CO2: 31 mmol/L (ref 22–32)
Chloride: 102 mmol/L (ref 101–111)
Creatinine, Ser: <0.3 mg/dL — ABNORMAL LOW (ref 0.61–1.24)
GLUCOSE: 116 mg/dL — AB (ref 65–99)
POTASSIUM: 4.7 mmol/L (ref 3.5–5.1)
SODIUM: 139 mmol/L (ref 135–145)

## 2015-09-26 LAB — CBC WITH DIFFERENTIAL/PLATELET
BASOS ABS: 0 10*3/uL (ref 0.0–0.1)
Basophils Relative: 1 %
EOS ABS: 0.4 10*3/uL (ref 0.0–0.7)
EOS PCT: 6 %
HCT: 36.6 % — ABNORMAL LOW (ref 39.0–52.0)
Hemoglobin: 11.6 g/dL — ABNORMAL LOW (ref 13.0–17.0)
Lymphocytes Relative: 17 %
Lymphs Abs: 1.1 10*3/uL (ref 0.7–4.0)
MCH: 29.4 pg (ref 26.0–34.0)
MCHC: 31.7 g/dL (ref 30.0–36.0)
MCV: 92.7 fL (ref 78.0–100.0)
MONO ABS: 0.9 10*3/uL (ref 0.1–1.0)
Monocytes Relative: 15 %
Neutro Abs: 3.7 10*3/uL (ref 1.7–7.7)
Neutrophils Relative %: 61 %
Platelets: 276 10*3/uL (ref 150–400)
RBC: 3.95 MIL/uL — AB (ref 4.22–5.81)
RDW: 14.5 % (ref 11.5–15.5)
WBC: 6.1 10*3/uL (ref 4.0–10.5)

## 2015-09-26 LAB — C-REACTIVE PROTEIN: CRP: 3.5 mg/dL — ABNORMAL HIGH (ref <1.0)
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
## 2015-09-27 ENCOUNTER — Other Ambulatory Visit (HOSPITAL_COMMUNITY): Payer: Self-pay

## 2015-09-27 LAB — APTT: APTT: 34 s (ref 24–37)

## 2015-09-27 LAB — PROTIME-INR
INR: 1.01 (ref 0.00–1.49)
PROTHROMBIN TIME: 13.5 s (ref 11.6–15.2)

## 2015-09-27 MED ORDER — FENTANYL CITRATE (PF) 100 MCG/2ML IJ SOLN
INTRAMUSCULAR | Status: AC
  Filled 2015-09-27: qty 4

## 2015-09-27 MED ORDER — LIDOCAINE HCL 1 % IJ SOLN
INTRAMUSCULAR | Status: AC
  Filled 2015-09-27: qty 20

## 2015-09-27 MED ORDER — IOHEXOL 300 MG/ML  SOLN
50.0000 mL | Freq: Once | INTRAMUSCULAR | Status: AC | PRN
  Administered 2015-09-27: 20 mL

## 2015-09-27 MED ORDER — MIDAZOLAM HCL 2 MG/2ML IJ SOLN
INTRAMUSCULAR | Status: AC | PRN
  Administered 2015-09-27 (×2): 1 mg via INTRAVENOUS

## 2015-09-27 MED ORDER — MIDAZOLAM HCL 2 MG/2ML IJ SOLN
INTRAMUSCULAR | Status: AC
  Filled 2015-09-27: qty 4

## 2015-09-27 MED ORDER — FENTANYL CITRATE (PF) 100 MCG/2ML IJ SOLN
INTRAMUSCULAR | Status: AC | PRN
  Administered 2015-09-27: 50 ug via INTRAVENOUS
  Administered 2015-09-27 (×2): 25 ug via INTRAVENOUS

## 2015-09-27 NOTE — Sedation Documentation (Signed)
Patient is resting comfortably. 
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
## 2015-09-27 NOTE — Procedures (Signed)
Successful fluoroscopic guided insertion of gastrostomy tube.   The gastrostomy tube may be used immediately for medications.   Tube feeds may be initiated in 24 hours as per the primary team.   EBL: Minimal No immediate post procedural complications.   Jay Tammala Weider, MD Pager #: [phone]    

## 2015-09-28 ENCOUNTER — Other Ambulatory Visit (HOSPITAL_COMMUNITY): Payer: Self-pay

## 2015-09-29 LAB — BASIC METABOLIC PANEL
Anion gap: 9 (ref 5–15)
BUN: 31 mg/dL — AB (ref 6–20)
CO2: 32 mmol/L (ref 22–32)
Calcium: 8.8 mg/dL — ABNORMAL LOW (ref 8.9–10.3)
Chloride: 98 mmol/L — ABNORMAL LOW (ref 101–111)
Glucose, Bld: 110 mg/dL — ABNORMAL HIGH (ref 65–99)
Potassium: 4 mmol/L (ref 3.5–5.1)
SODIUM: 139 mmol/L (ref 135–145)

## 2015-09-29 LAB — CBC WITH DIFFERENTIAL/PLATELET
BASOS PCT: 1 %
Basophils Absolute: 0.1 10*3/uL (ref 0.0–0.1)
EOS ABS: 0.4 10*3/uL (ref 0.0–0.7)
EOS PCT: 6 %
HCT: 34.9 % — ABNORMAL LOW (ref 39.0–52.0)
HEMOGLOBIN: 11.2 g/dL — AB (ref 13.0–17.0)
LYMPHS ABS: 1.1 10*3/uL (ref 0.7–4.0)
Lymphocytes Relative: 19 %
MCH: 29.4 pg (ref 26.0–34.0)
MCHC: 32.1 g/dL (ref 30.0–36.0)
MCV: 91.6 fL (ref 78.0–100.0)
MONOS PCT: 13 %
Monocytes Absolute: 0.8 10*3/uL (ref 0.1–1.0)
NEUTROS PCT: 61 %
Neutro Abs: 3.8 10*3/uL (ref 1.7–7.7)
PLATELETS: 271 10*3/uL (ref 150–400)
RBC: 3.81 MIL/uL — AB (ref 4.22–5.81)
RDW: 14.2 % (ref 11.5–15.5)
WBC: 6.1 10*3/uL (ref 4.0–10.5)

## 2015-10-03 ENCOUNTER — Other Ambulatory Visit (HOSPITAL_COMMUNITY): Payer: Self-pay

## 2015-10-03 LAB — BASIC METABOLIC PANEL
ANION GAP: 7 (ref 5–15)
BUN: 33 mg/dL — ABNORMAL HIGH (ref 6–20)
CALCIUM: 9 mg/dL (ref 8.9–10.3)
CO2: 31 mmol/L (ref 22–32)
Chloride: 103 mmol/L (ref 101–111)
Creatinine, Ser: <0.3 mg/dL — ABNORMAL LOW (ref 0.61–1.24)
Glucose, Bld: 105 mg/dL — ABNORMAL HIGH (ref 65–99)
POTASSIUM: 4.5 mmol/L (ref 3.5–5.1)
SODIUM: 141 mmol/L (ref 135–145)

## 2015-10-03 LAB — C-REACTIVE PROTEIN: CRP: 0.7 mg/dL (ref <1.0)

## 2015-10-03 LAB — CBC WITH DIFFERENTIAL/PLATELET
BASOS ABS: 0.1 10*3/uL (ref 0.0–0.1)
BASOS PCT: 1 %
EOS PCT: 8 %
Eosinophils Absolute: 0.5 10*3/uL (ref 0.0–0.7)
HCT: 38.3 % — ABNORMAL LOW (ref 39.0–52.0)
Hemoglobin: 11.6 g/dL — ABNORMAL LOW (ref 13.0–17.0)
LYMPHS PCT: 22 %
Lymphs Abs: 1.3 10*3/uL (ref 0.7–4.0)
MCH: 28 pg (ref 26.0–34.0)
MCHC: 30.3 g/dL (ref 30.0–36.0)
MCV: 92.5 fL (ref 78.0–100.0)
MONO ABS: 0.5 10*3/uL (ref 0.1–1.0)
Monocytes Relative: 9 %
NEUTROS ABS: 3.5 10*3/uL (ref 1.7–7.7)
Neutrophils Relative %: 60 %
PLATELETS: 294 10*3/uL (ref 150–400)
RBC: 4.14 MIL/uL — AB (ref 4.22–5.81)
RDW: 14.3 % (ref 11.5–15.5)
WBC: 5.8 10*3/uL (ref 4.0–10.5)

## 2015-10-03 LAB — PHOSPHORUS: PHOSPHORUS: 2.7 mg/dL (ref 2.5–4.6)

## 2015-10-03 LAB — SEDIMENTATION RATE: SED RATE: 57 mm/h — AB (ref 0–16)

## 2015-10-03 LAB — MAGNESIUM: MAGNESIUM: 2.3 mg/dL (ref 1.7–2.4)

## 2015-10-10 LAB — BASIC METABOLIC PANEL
Anion gap: 7 (ref 5–15)
BUN: 39 mg/dL — AB (ref 6–20)
CO2: 27 mmol/L (ref 22–32)
Calcium: 9 mg/dL (ref 8.9–10.3)
Chloride: 106 mmol/L (ref 101–111)
GLUCOSE: 97 mg/dL (ref 65–99)
Potassium: 3.7 mmol/L (ref 3.5–5.1)
SODIUM: 140 mmol/L (ref 135–145)

## 2015-10-10 LAB — CBC WITH DIFFERENTIAL/PLATELET
Basophils Absolute: 0 10*3/uL (ref 0.0–0.1)
Basophils Relative: 1 %
EOS ABS: 0.4 10*3/uL (ref 0.0–0.7)
EOS PCT: 7 %
HCT: 39.4 % (ref 39.0–52.0)
Hemoglobin: 12.3 g/dL — ABNORMAL LOW (ref 13.0–17.0)
LYMPHS ABS: 1.2 10*3/uL (ref 0.7–4.0)
Lymphocytes Relative: 25 %
MCH: 28.1 pg (ref 26.0–34.0)
MCHC: 31.2 g/dL (ref 30.0–36.0)
MCV: 90 fL (ref 78.0–100.0)
MONO ABS: 0.6 10*3/uL (ref 0.1–1.0)
MONOS PCT: 12 %
Neutro Abs: 2.7 10*3/uL (ref 1.7–7.7)
Neutrophils Relative %: 55 %
PLATELETS: 276 10*3/uL (ref 150–400)
RBC: 4.38 MIL/uL (ref 4.22–5.81)
RDW: 14.6 % (ref 11.5–15.5)
WBC: 4.9 10*3/uL (ref 4.0–10.5)

## 2015-10-10 LAB — C-REACTIVE PROTEIN: CRP: 0.5 mg/dL (ref <1.0)

## 2015-10-10 LAB — MAGNESIUM: Magnesium: 2.4 mg/dL (ref 1.7–2.4)

## 2015-10-10 LAB — SEDIMENTATION RATE: Sed Rate: 39 mm/hr — ABNORMAL HIGH (ref 0–16)

## 2015-10-10 LAB — PHOSPHORUS: Phosphorus: 2.9 mg/dL (ref 2.5–4.6)

## 2016-06-06 ENCOUNTER — Ambulatory Visit (INDEPENDENT_AMBULATORY_CARE_PROVIDER_SITE_OTHER): Payer: Medicare Other | Admitting: Family

## 2016-06-06 DIAGNOSIS — S42209A Unspecified fracture of upper end of unspecified humerus, initial encounter for closed fracture: Secondary | ICD-10-CM | POA: Insufficient documentation

## 2016-06-06 DIAGNOSIS — S42291A Other displaced fracture of upper end of right humerus, initial encounter for closed fracture: Secondary | ICD-10-CM | POA: Diagnosis not present

## 2016-06-06 MED ORDER — OXYCODONE-ACETAMINOPHEN 5-325 MG PO TABS: 1.0000 | ORAL_TABLET | Freq: Four times a day (QID) | ORAL | 0 refills | Status: DC | PRN
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
## 2016-06-13 ENCOUNTER — Ambulatory Visit (INDEPENDENT_AMBULATORY_CARE_PROVIDER_SITE_OTHER): Payer: Self-pay | Admitting: Orthopedic Surgery

## 2016-06-22 ENCOUNTER — Ambulatory Visit (INDEPENDENT_AMBULATORY_CARE_PROVIDER_SITE_OTHER): Payer: Medicare Other

## 2016-06-22 ENCOUNTER — Ambulatory Visit (INDEPENDENT_AMBULATORY_CARE_PROVIDER_SITE_OTHER): Payer: Medicare Other | Admitting: Family

## 2016-06-22 DIAGNOSIS — S42014D Posterior displaced fracture of sternal end of right clavicle, subsequent encounter for fracture with routine healing: Secondary | ICD-10-CM | POA: Diagnosis not present

## 2016-06-22 DIAGNOSIS — S2241XD Multiple fractures of ribs, right side, subsequent encounter for fracture with routine healing: Secondary | ICD-10-CM | POA: Diagnosis not present

## 2016-06-22 DIAGNOSIS — S42291A Other displaced fracture of upper end of right humerus, initial encounter for closed fracture: Secondary | ICD-10-CM

## 2016-06-22 DIAGNOSIS — S2241XA Multiple fractures of ribs, right side, initial encounter for closed fracture: Secondary | ICD-10-CM | POA: Insufficient documentation

## 2016-06-22 MED ORDER — OXYCODONE-ACETAMINOPHEN 5-325 MG PO TABS: 1.0000 | ORAL_TABLET | Freq: Two times a day (BID) | ORAL | 0 refills | Status: DC | PRN

## 2016-06-22 NOTE — Progress Notes (Addendum)
Office Visit Note   Patient: [name] [name]           Date of Birth: Sep 30, 1960           MRN: [id number] Visit Date: 06/22/2016              Requested by: Loyal JacobsonMichael Kalish, MD [address] [place] [id number] PCP: Sid FalconKALISH, MICHAEL J, MD   Assessment & Plan: Visit Diagnoses:  1. Other closed displaced fracture of proximal end of right humerus, initial encounter   2. Closed fracture of multiple ribs of right side with routine healing, subsequent encounter   3. Closed posterior displaced fracture of sternal end of right clavicle with routine healing, subsequent encounter     Plan: he may discontinue the sling. Work on pendulum swings. Have discussed ways to modify wall him walking such as over the kitchen table as he is wheelchair bound. Also provided an order for physical therapy, with home health. He will follow up once more in 2 more weeks to provided a last prescription for pain medication.  Follow-Up Instructions: Return in about 2 weeks (around 07/06/2016).   Orders:  Orders Placed This Encounter  Procedures  . XR Humerus Right  . Ambulatory referral to Physical Therapy   Meds ordered this encounter  Medications  . oxyCODONE-acetaminophen (PERCOCET/ROXICET) 5-325 MG tablet    Sig: Take 1 tablet by mouth 2 (two) times daily as needed for severe pain.    Dispense:  30 tablet    Refill:  0      Procedures: No procedures performed   Clinical Data: No additional findings.   Subjective: Chief Complaint  Patient presents with  . Right Shoulder - Follow-up    [name] [name] is a 55 year old gentleman seen in follow-up for a right humerus fracture and right 1st and 2nd rib fractures. He is wearing his sling today. He states he doesn't have any pain while he isn't moving his arm. He complains of pain with ROM and laying on right side. He does complain of soreness on his right side buttocks since he is unable to lay on that side.   Has been working on  range of motion of the right elbow and wrist.    Review of Systems  Constitutional: Negative for chills and fever.  Musculoskeletal: Positive for arthralgias.     Objective: Vital Signs: There were no vitals taken for this visit.  Physical Exam  Constitutional: He is oriented to person, place, and time. He appears well-developed and well-nourished.  Pulmonary/Chest: Effort normal.  Musculoskeletal:       Right upper arm: He exhibits bony tenderness. He exhibits no swelling.  Tenderness to the proximal humerus.  Neurological: He is alert and oriented to person, place, and time.  Psychiatric: He has a normal mood and affect.  Nursing note reviewed.   Ortho Exam  Specialty Comments:  No specialty comments available.  Imaging: Xr Humerus Right  Result Date: 06/22/2016 Radiographs of the right humerus show the minimally displaced proximal humerus fracture with early callus formation. The displaced fractures of the right second and third ribs is also seen. A proximal clavicle fracture without displacement is also seen.     PMFS History: Patient Active Problem List   Diagnosis Date Noted  . Multiple closed fractures of ribs of right side 06/22/2016  . Proximal humerus fracture 06/06/2016  . Lung collapse left lung 09/16/2015  . 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  Colostomy care (HCC) 09/16/2015  . S/P AKA (above knee amputation) bilateral (HCC) 09/16/2015  . Sacral decubitus ulcer 09/16/2015  . BRONCHITIS, CHRONIC, ACUTE EXACERBATION 09/15/2009  . C1-C4 LEVEL SPINAL CORD INJURY UNSPECIFIED 09/15/2009   No past medical history on file.  No family history on file.  No past surgical history on file. Social History   Occupational History  . Not on file.   Social History Main Topics  . Smoking status: Not on file  . Smokeless tobacco: Not on file  . Alcohol use Not on file  . Drug use: Unknown  . Sexual activity: Not on file

## 2016-06-26 ENCOUNTER — Telehealth (INDEPENDENT_AMBULATORY_CARE_PROVIDER_SITE_OTHER): Payer: Self-pay | Admitting: Orthopedic Surgery

## 2016-06-26 NOTE — Telephone Encounter (Signed)
Pt called stating he had an appt 12/15 and was referred to physical therapy home health but hasn't heard back. Pt requesting call back [phone]

## 2016-06-26 NOTE — Telephone Encounter (Signed)
[name] [name] has called patient and [facility], was advised that this has already been done prior but we are more than happy to resend.

## 2016-07-06 ENCOUNTER — Ambulatory Visit (INDEPENDENT_AMBULATORY_CARE_PROVIDER_SITE_OTHER): Payer: Medicare Other | Admitting: Family

## 2016-07-06 DIAGNOSIS — S2241XD Multiple fractures of ribs, right side, subsequent encounter for fracture with routine healing: Secondary | ICD-10-CM

## 2016-07-06 DIAGNOSIS — S42291A Other displaced fracture of upper end of right humerus, initial encounter for closed fracture: Secondary | ICD-10-CM

## 2016-07-06 MED ORDER — OXYCODONE-ACETAMINOPHEN 5-325 MG PO TABS: 1.0000 | ORAL_TABLET | Freq: Four times a day (QID) | ORAL | 0 refills | Status: AC | PRN

## 2016-07-06 NOTE — Progress Notes (Signed)
Office Visit Note   Patient: [name] LoftsDanny R Ramires           Date of Birth: September 27, 1960           MRN: [id number] Visit Date: 07/06/2016              Requested by: Loyal JacobsonMichael Kalish, MD [address] [place], [place] [id number] PCP: Sid FalconKALISH, MICHAEL J, MD   Assessment & Plan: Visit Diagnoses:  1. Other closed displaced fracture of proximal end of right humerus, initial encounter   2. Closed fracture of multiple ribs of right side with routine healing, subsequent encounter     Plan: Continue with home physical therapy. Work aggressively on active range of motion. We will follow-up with him in 4 more weeks. Have provided a refill on his Percocet.  Follow-Up Instructions: Return in about 4 weeks (around 08/03/2016).   Orders:  No orders of the defined types were placed in this encounter.  Meds ordered this encounter  Medications  . oxyCODONE-acetaminophen (PERCOCET/ROXICET) 5-325 MG tablet    Sig: Take 1 tablet by mouth every 6 (six) hours as needed for severe pain.    Dispense:  60 tablet    Refill:  0      Procedures: No procedures performed   Clinical Data: No additional findings.   Subjective: Chief Complaint  Patient presents with  . Right Arm - Fracture    Patient is a 55 year old gentleman who is seen about 6 weeks following a right proximal humerus fracture as well as multiple right-sided rib fractures and a right clavicle fracture. He has been working with home physical therapy on range of motion. Complains of continued pain which is worse at night. Patient is bedbound and does have a decubitus ulcer of sacrum. Has difficulty with discomfort with transfers.    Review of Systems  Constitutional: Negative for chills and fever.  Musculoskeletal: Positive for arthralgias and myalgias.     Objective: Vital Signs: There were no vitals taken for this visit.  Physical Exam  Constitutional: He is oriented to person, place, and time. He appears well-developed  and well-nourished.  Pulmonary/Chest: Effort normal.  Neurological: He is alert and oriented to person, place, and time.  Psychiatric: He has a normal mood and affect.  Nursing note reviewed.   Right Shoulder Exam   Tenderness  Right shoulder tenderness location: No clavicle or rib tenderness, does have proximal humerus bony tenderness.  Range of Motion  Right shoulder active abduction: Poor effort.  Right shoulder passive abduction: To 80  Right shoulder extension: Poor effort.   Other  Erythema: absent Pulse: present      Specialty Comments:  No specialty comments available.  Imaging: No results found.   PMFS History: Patient Active Problem List   Diagnosis Date Noted  . Multiple closed fractures of ribs of right side 06/22/2016  . Proximal humerus fracture 06/06/2016  . Lung collapse left lung 09/16/2015  . 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 Colostomy care (HCC) 09/16/2015  . S/P AKA (above knee amputation) bilateral (HCC) 09/16/2015  . Sacral decubitus ulcer 09/16/2015  . BRONCHITIS, CHRONIC, ACUTE EXACERBATION 09/15/2009  . C1-C4 LEVEL SPINAL CORD INJURY UNSPECIFIED 09/15/2009   No past medical history on file.  No family history on file.  No past surgical history on file. Social History   Occupational History  . Not on file.   Social History Main Topics  . Smoking status: Not on file  .  Smokeless tobacco: Not on file  . Alcohol use Not on file  . Drug use: Unknown  . Sexual activity: Not on file

## 2016-07-17 ENCOUNTER — Telehealth (INDEPENDENT_AMBULATORY_CARE_PROVIDER_SITE_OTHER): Payer: Self-pay | Admitting: *Deleted

## 2016-07-17 NOTE — Telephone Encounter (Signed)
[facility] with [facility] called asking to extend physical therapy 2x a week for 3 more weeks

## 2016-07-18 NOTE — Telephone Encounter (Signed)
I called and left voicemail for Shay for extension of therapy.

## 2016-08-17 ENCOUNTER — Ambulatory Visit (INDEPENDENT_AMBULATORY_CARE_PROVIDER_SITE_OTHER): Payer: Medicare Other | Admitting: Family

## 2018-03-13 IMAGING — CR DG CHEST 1V PORT
1 series · 1 of 1 positions shown · non-contrast
Comparison: One-view chest x-ray 09/28/2015.

CLINICAL DATA: Shortness of breath.  Pneumonia.

EXAM:
PORTABLE CHEST - 1 VIEW

[AP]
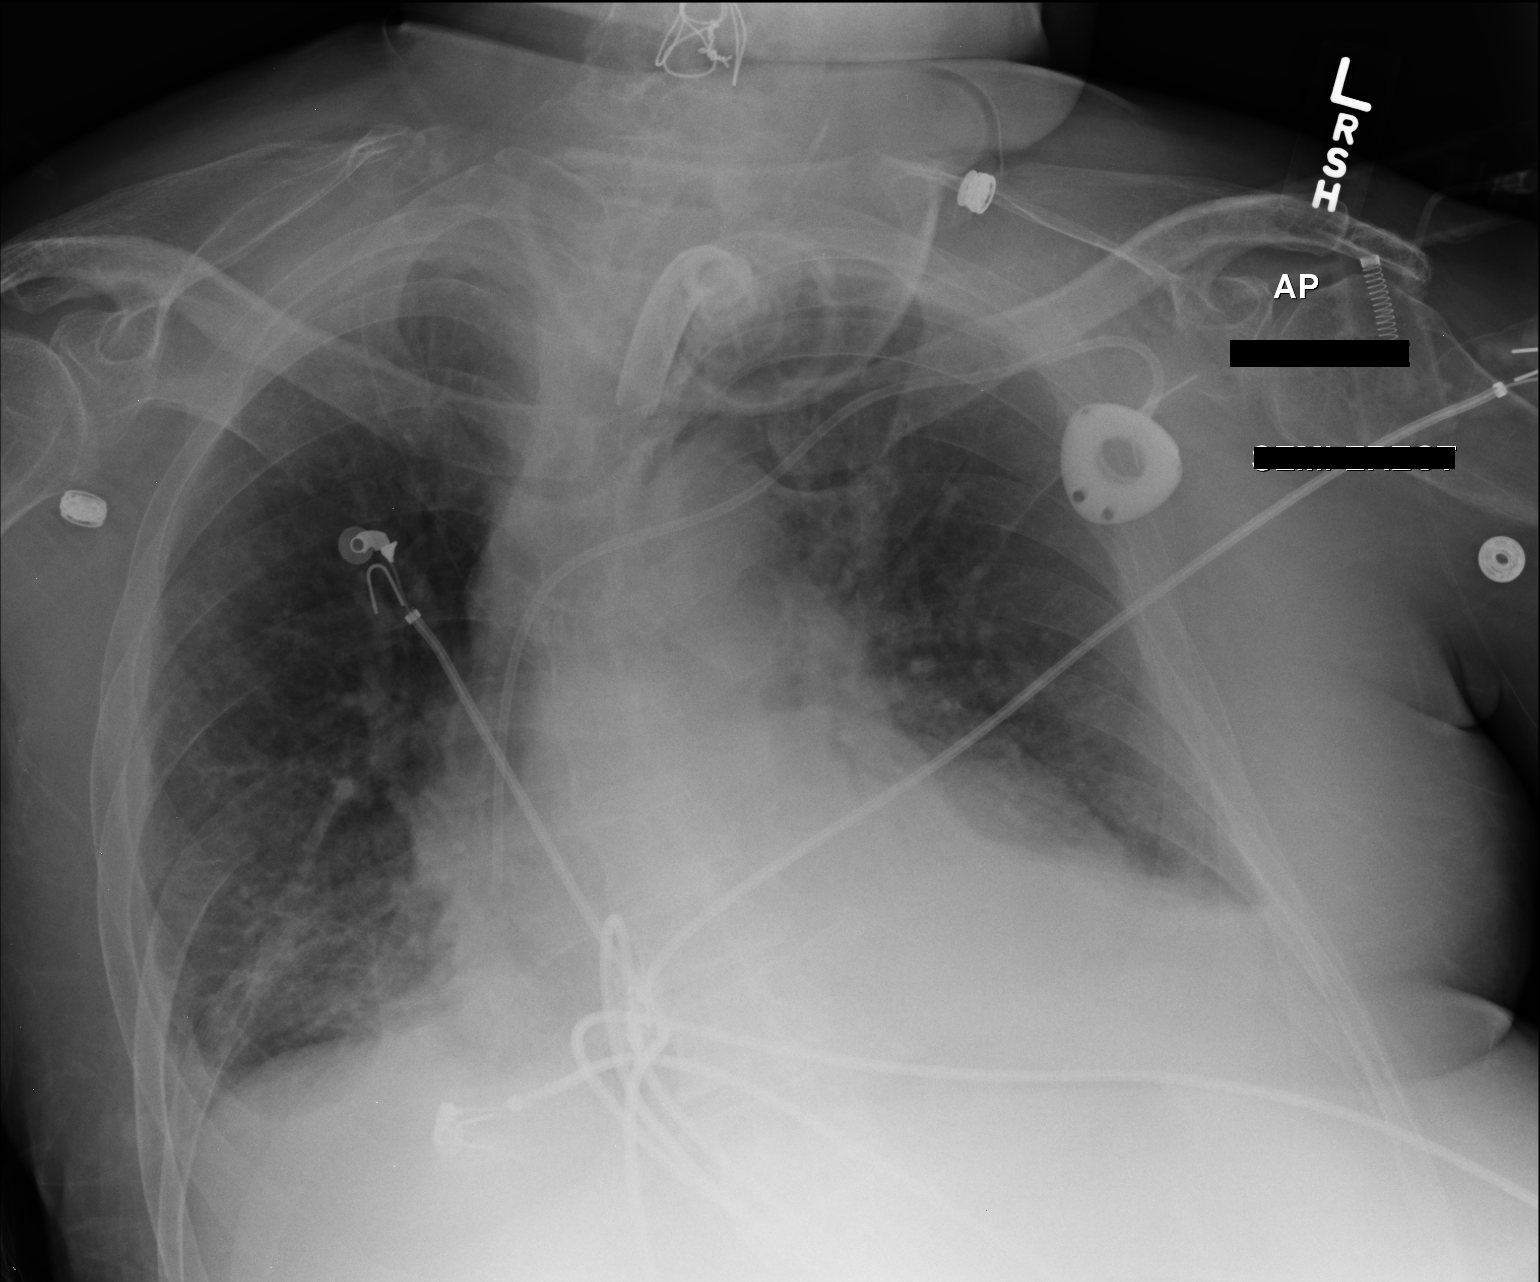

[1 of 1 positions shown; findings below may reference images not displayed]

FINDINGS: A tracheostomy tube in is in satisfactory position. The left
subclavian Port-A-Cath is stable. The heart is enlarged. Edema is
improved. Bilateral pleural effusions are slightly decreased. Left
greater than right basilar airspace disease persists.
IMPRESSION: 1. Slightly improved aeration and decreasing edema.
2. Decreasing bilateral pleural effusion.
3. Bilateral lower lobe airspace disease, left greater than right is
concerning for pneumonia.

## 2023-09-07 DEATH — deceased
